# Patient Record
Sex: Female | Born: 1990 | Hispanic: Yes | Marital: Married | Smoking: Never smoker
Health system: Southern US, Community
[De-identification: ages and names within clinical notes are randomized; demographics above are authoritative.]

## PROBLEM LIST (undated history)

## (undated) DIAGNOSIS — L509 Urticaria, unspecified: Secondary | ICD-10-CM

## (undated) HISTORY — DX: Urticaria, unspecified: L50.9

---

## 2016-01-27 ENCOUNTER — Ambulatory Visit (INDEPENDENT_AMBULATORY_CARE_PROVIDER_SITE_OTHER): Payer: PRIVATE HEALTH INSURANCE | Admitting: Allergy

## 2016-01-27 ENCOUNTER — Encounter (INDEPENDENT_AMBULATORY_CARE_PROVIDER_SITE_OTHER): Payer: Self-pay

## 2016-01-27 ENCOUNTER — Encounter: Payer: Self-pay | Admitting: Allergy

## 2016-01-27 VITALS — BP 122/78 | HR 100 | Temp 98.1°F | Resp 18 | Ht 62.52 in | Wt 178.8 lb

## 2016-01-27 DIAGNOSIS — J309 Allergic rhinitis, unspecified: Secondary | ICD-10-CM | POA: Diagnosis not present

## 2016-01-27 DIAGNOSIS — L508 Other urticaria: Secondary | ICD-10-CM

## 2016-01-27 DIAGNOSIS — H1010 Acute atopic conjunctivitis, unspecified eye: Secondary | ICD-10-CM | POA: Diagnosis not present

## 2016-01-27 MED ORDER — MONTELUKAST SODIUM 10 MG PO TABS: ORAL_TABLET | ORAL | 5 refills | Status: AC

## 2016-01-27 NOTE — Progress Notes (Signed)
New Patient Note  RE: Cheryl Horne MRN: [id number] DOB: March 06, 1991 Date of Office Visit: 01/27/2016  Referring provider: No ref. provider found Primary care provider: No primary care provider on file.  Chief Complaint: Hives   History of present illness: Cheryl Horne is a 25 y.o. female presenting today for evaluation of hives.  It was recommended from her urgent care visit that she see an allergist for evaluation. She had episode of hives when she was in her teenage years. They eventually went away and she was hive free for about 10 years. The hives return in about a month ago. She is feels that the hives are bit worse now that she remembers as a teen. Again she does not know what triggered the hives. She describes them as large welts that are itchy and can be all over. She has had facial swelling as well as some hand swelling with the hives. They last about an hour or 2 before resolving and do not leave any marks or bruising. She denies any fevers or joint pain or arthralgias.  Currently she has been having them almost daily for more than a month. She states she recalls eating salmon on the day the hives started but she has had salmon previously without any problems. She denies any preceding illnesses, no medications, no stings, change in soaps/lotions/detergents. She does state her dog Puppies around the time of hives started in the puppies and dog developed fleas which needed to be treated. She reports using Benadryl which she feels like has made the hives weren't as and has on occasion use ibuprofen for menstrual-related pain which she also feels made the hives worse. Went to UC and got a steroid shot and sent home with prednisone which did help but hives return after she completed steroid course.   She reports seasonal symptoms in the spring with itchy watery eyes and some nasal congestion and drainage.  Uses flonase on occasion for her nasal symptoms.   She denies any history of asthma or  eczema or drug allergy.   Review of systems: Review of Systems  Constitutional: Negative for chills, fever and malaise/fatigue.  HENT: Negative for congestion and sore throat.   Eyes: Negative for redness.  Respiratory: Negative for cough, shortness of breath and wheezing.   Cardiovascular: Negative for chest pain.  Gastrointestinal: Negative for nausea and vomiting.  Skin: Positive for itching and rash.  Neurological: Negative for headaches.    All other systems negative unless noted above in HPI  Past medical history: Past Medical History:  Diagnosis Date  . Urticaria     Past surgical history: History reviewed. No pertinent surgical history.  Family history:  Family History  Problem Relation Age of Onset  . Allergic rhinitis Mother   . Diabetes Maternal Uncle   . Diabetes Maternal Uncle   . Diabetes Maternal Uncle   No family history of urticaria or angioedema  Social history: She lives in an apartment with carpeting with gas heating and central cooling. There are dogs in the apartment. She works as a Marine scientist.   Medication List:   Medication List       Accurate as of 01/27/16  4:49 PM. Always use your most recent med list.          montelukast 10 MG tablet Commonly known as:  SINGULAIR Take one tablet once daily as directed       Known medication allergies: No Known Allergies   Physical examination: Blood pressure 122/78,  pulse 100, temperature 98.1 F (36.7 C), temperature source Oral, resp. rate 18, height 5' 2.52" (1.588 m), weight 178 lb 12.8 oz (81.1 kg).  General: Alert, interactive, in no acute distress. HEENT: TMs pearly gray, turbinates minimally edematous without discharge, post-pharynx non erythematous. Neck: Supple without lymphadenopathy. Lungs: Clear to auscultation without wheezing, rhonchi or rales. {no increased work of breathing. CV: Normal S1, S2 without murmurs. Abdomen: Nondistended, nontender. Skin: Warm and dry, without  lesions or rashes.No urticarial lesions on exam today Extremities:  No clubbing, cyanosis or edema. Neuro:   Grossly intact.  Diagnositics/Labs: Allergy testing: Deferred due to ongoing urticaria   Assessment and plan:   Urticaria with angioedema   - No identifiable cause at this time. Hives are likely idiopathic in nature. She had an episode of hives in her teen years and was hive free for 10 years. It is not unusual for patients with a history of urticaria to have urticaria returned even years later.   - no concerning features of her hives at this time - she will let us know if she starts to have fevers, joint pains or hives start to leave marks or bruising   - start Allegra 126m or Zyrtec 130mtwice a day             Zantac 15060mwice a day             Singulair 62m74m bedtime   - will obtain the following labs to look for any underlying causes: CBC w diff, CMP, tryptase, ESR, hive panel   - If she fails on high-dose antihistamines will consider Xolair  Allergic rhinoconjunctivitis    - Presumed allergic given seasonality of symptoms    - will obtain environmental allergen panel    - use antihistamine as above     - use Flonase 1-2 sprays each nostril as needed for nasal congestion or drainage   Follow-up 1-2 months  I appreciate the opportunity to take part in Ciela's care. Please do not hesitate to contact me with questions.  Sincerely,   ShayPrudy Feeler [facility]

## 2016-01-27 NOTE — Patient Instructions (Signed)
Hives   - no concerning features of your hives at this time - let us know if you start to have fevers, joint pains or hives start to leave marks or bruising   - start Allegra 133m or Zyrtec 181mtwice a day             Zantac 1504mwice a day             Singulair 71m58m bedtime   - will obtain the following labs to look for any underlying causes: CBC w diff, CMP, tryptase, ESR, hive panel  Seasonal allergies    - will obtain environmental allergen panel    - use antihistamine as above     - use Flonase 1-2 sprays each nostril as needed for nasal congestion or drainage   Follow-up 1-2 months

## 2016-02-04 LAB — ALLERGENS W/TOTAL IGE AREA 2
Alternaria Alternata IgE: <0.1 kU/L
Aspergillus Fumigatus IgE: <0.1 kU/L
Bermuda Grass IgE: 14.9 kU/L — AB
Cedar, Mountain IgE: <0.1 kU/L
Cladosporium Herbarum IgE: <0.1 kU/L
Cockroach, German IgE: 0.11 kU/L — AB
Common Silver Birch IgE: <0.1 kU/L
Cottonwood IgE: <0.1 kU/L
D Pteronyssinus IgE: <0.1 kU/L
Dog Dander IgE: <0.1 kU/L
IgE (Immunoglobulin E), Serum: 313 IU/mL — ABNORMAL HIGH (ref 0–100)
Johnson Grass IgE: 8.69 kU/L — AB
Pigweed, Rough IgE: <0.1 kU/L
Ragweed, Short IgE: <0.1 kU/L
SHEEP SORREL IGE QN: 0.47 kU/L — AB
TIMOTHY IGE: 33.5 kU/L — AB
White Mulberry IgE: <0.1 kU/L

## 2016-02-04 LAB — CBC WITH DIFFERENTIAL/PLATELET
BASOS: 0 %
Basophils Absolute: 0 10*3/uL (ref 0.0–0.2)
EOS (ABSOLUTE): 0.1 10*3/uL (ref 0.0–0.4)
Eos: 1 %
Hematocrit: 43.9 % (ref 34.0–46.6)
Hemoglobin: 14.6 g/dL (ref 11.1–15.9)
IMMATURE GRANULOCYTES: 0 %
Immature Grans (Abs): 0 10*3/uL (ref 0.0–0.1)
LYMPHS ABS: 2.9 10*3/uL (ref 0.7–3.1)
Lymphs: 30 %
MCH: 29.4 pg (ref 26.6–33.0)
MCHC: 33.3 g/dL (ref 31.5–35.7)
MCV: 88 fL (ref 79–97)
MONOS ABS: 0.8 10*3/uL (ref 0.1–0.9)
Monocytes: 8 %
NEUTROS PCT: 61 %
Neutrophils Absolute: 5.7 10*3/uL (ref 1.4–7.0)
PLATELETS: 266 10*3/uL (ref 150–379)
RBC: 4.97 x10E6/uL (ref 3.77–5.28)
RDW: 15 % (ref 12.3–15.4)
WBC: 9.5 10*3/uL (ref 3.4–10.8)

## 2016-02-04 LAB — COMPREHENSIVE METABOLIC PANEL
A/G RATIO: 1.7 (ref 1.2–2.2)
ALT: 9 IU/L (ref 0–32)
AST: 15 IU/L (ref 0–40)
Albumin: 4.2 g/dL (ref 3.5–5.5)
Alkaline Phosphatase: 73 IU/L (ref 39–117)
BUN/Creatinine Ratio: 16 (ref 9–23)
BUN: 11 mg/dL (ref 6–20)
Bilirubin Total: 0.3 mg/dL (ref 0.0–1.2)
CALCIUM: 9.1 mg/dL (ref 8.7–10.2)
CO2: 22 mmol/L (ref 18–29)
CREATININE: 0.67 mg/dL (ref 0.57–1.00)
Chloride: 98 mmol/L (ref 96–106)
GFR calc Af Amer: 141 mL/min/{1.73_m2} (ref >59)
GFR, EST NON AFRICAN AMERICAN: 123 mL/min/{1.73_m2} (ref >59)
GLUCOSE: 70 mg/dL (ref 65–99)
Globulin, Total: 2.5 g/dL (ref 1.5–4.5)
POTASSIUM: 3.5 mmol/L (ref 3.5–5.2)
Sodium: 137 mmol/L (ref 134–144)
Total Protein: 6.7 g/dL (ref 6.0–8.5)

## 2016-02-04 LAB — SEDIMENTATION RATE: SED RATE: 12 mm/h (ref 0–32)

## 2016-02-04 LAB — TRYPTASE: Tryptase: 2.5 ug/L (ref 2.2–13.2)

## 2016-02-04 LAB — CHRONIC URTICARIA: CU INDEX: 16 — AB (ref <10)

## 2016-03-16 ENCOUNTER — Ambulatory Visit: Payer: Self-pay | Admitting: Allergy

## 2016-03-31 ENCOUNTER — Telehealth: Payer: Self-pay | Admitting: *Deleted

## 2016-03-31 NOTE — Telephone Encounter (Signed)
Patient called an advised hives getting worse even on antihistamine therapy. You had discussed with her starting Xolair and she wants to go forward with same but will not have insurance till January 2018 on her husbands policy.  I told her I would ask you if okay to start her on sample at this time.

## 2016-04-01 NOTE — Telephone Encounter (Signed)
Discussed via phone ok to give Xolair sample 300mg  while await her new insurance coverage in 2018.

## 2016-04-02 ENCOUNTER — Ambulatory Visit (INDEPENDENT_AMBULATORY_CARE_PROVIDER_SITE_OTHER): Payer: PRIVATE HEALTH INSURANCE

## 2016-04-02 DIAGNOSIS — L501 Idiopathic urticaria: Secondary | ICD-10-CM

## 2016-04-02 DIAGNOSIS — L508 Other urticaria: Secondary | ICD-10-CM

## 2016-04-02 MED ORDER — OMALIZUMAB 150 MG ~~LOC~~ SOLR
300.0000 mg | SUBCUTANEOUS | Status: AC
  Administered 2016-04-02 – 2016-10-26 (×7): 300 mg via SUBCUTANEOUS

## 2016-05-07 ENCOUNTER — Ambulatory Visit (INDEPENDENT_AMBULATORY_CARE_PROVIDER_SITE_OTHER): Payer: PRIVATE HEALTH INSURANCE

## 2016-05-07 DIAGNOSIS — L501 Idiopathic urticaria: Secondary | ICD-10-CM

## 2016-06-17 ENCOUNTER — Ambulatory Visit (INDEPENDENT_AMBULATORY_CARE_PROVIDER_SITE_OTHER): Payer: PRIVATE HEALTH INSURANCE

## 2016-06-17 DIAGNOSIS — L501 Idiopathic urticaria: Secondary | ICD-10-CM | POA: Diagnosis not present

## 2016-07-27 ENCOUNTER — Ambulatory Visit (INDEPENDENT_AMBULATORY_CARE_PROVIDER_SITE_OTHER): Payer: Commercial Managed Care - PPO | Admitting: *Deleted

## 2016-07-27 DIAGNOSIS — L501 Idiopathic urticaria: Secondary | ICD-10-CM

## 2016-08-31 ENCOUNTER — Ambulatory Visit (INDEPENDENT_AMBULATORY_CARE_PROVIDER_SITE_OTHER): Payer: Commercial Managed Care - PPO

## 2016-08-31 DIAGNOSIS — L501 Idiopathic urticaria: Secondary | ICD-10-CM | POA: Diagnosis not present

## 2016-08-31 DIAGNOSIS — L508 Other urticaria: Secondary | ICD-10-CM

## 2016-09-28 ENCOUNTER — Ambulatory Visit (INDEPENDENT_AMBULATORY_CARE_PROVIDER_SITE_OTHER): Payer: Commercial Managed Care - PPO | Admitting: *Deleted

## 2016-09-28 DIAGNOSIS — L500 Allergic urticaria: Secondary | ICD-10-CM

## 2016-10-26 ENCOUNTER — Ambulatory Visit (INDEPENDENT_AMBULATORY_CARE_PROVIDER_SITE_OTHER): Payer: Commercial Managed Care - PPO | Admitting: *Deleted

## 2016-10-26 DIAGNOSIS — L500 Allergic urticaria: Secondary | ICD-10-CM

## 2016-11-23 ENCOUNTER — Ambulatory Visit: Payer: Self-pay

## 2019-06-08 ENCOUNTER — Other Ambulatory Visit: Payer: Self-pay | Admitting: Obstetrics and Gynecology

## 2019-06-08 ENCOUNTER — Ambulatory Visit
Admission: RE | Admit: 2019-06-08 | Discharge: 2019-06-08 | Disposition: A | Discharge status: Home / Self Care | Payer: Commercial Managed Care - PPO | Source: Ambulatory Visit | Attending: Obstetrics and Gynecology | Admitting: Obstetrics and Gynecology

## 2019-06-08 ENCOUNTER — Other Ambulatory Visit: Payer: Self-pay

## 2019-06-08 DIAGNOSIS — N61 Mastitis without abscess: Secondary | ICD-10-CM

## 2019-06-08 DIAGNOSIS — N63 Unspecified lump in unspecified breast: Secondary | ICD-10-CM

## 2019-06-13 ENCOUNTER — Ambulatory Visit
Admission: RE | Admit: 2019-06-13 | Discharge: 2019-06-13 | Disposition: A | Discharge status: Home / Self Care | Payer: Medicaid Other | Source: Ambulatory Visit | Attending: Obstetrics and Gynecology | Admitting: Obstetrics and Gynecology

## 2019-06-13 ENCOUNTER — Ambulatory Visit
Admission: RE | Admit: 2019-06-13 | Discharge: 2019-06-13 | Disposition: A | Discharge status: Home / Self Care | Payer: Self-pay | Source: Ambulatory Visit | Attending: Obstetrics and Gynecology | Admitting: Obstetrics and Gynecology

## 2019-06-13 ENCOUNTER — Other Ambulatory Visit: Payer: Self-pay | Admitting: Obstetrics and Gynecology

## 2019-06-13 ENCOUNTER — Other Ambulatory Visit: Payer: Self-pay

## 2019-06-13 DIAGNOSIS — N61 Mastitis without abscess: Secondary | ICD-10-CM

## 2019-06-18 LAB — AEROBIC/ANAEROBIC CULTURE W GRAM STAIN (SURGICAL/DEEP WOUND)

## 2019-06-25 ENCOUNTER — Other Ambulatory Visit: Payer: Self-pay | Admitting: Obstetrics and Gynecology

## 2019-06-25 ENCOUNTER — Other Ambulatory Visit: Payer: Self-pay

## 2019-06-25 ENCOUNTER — Ambulatory Visit
Admission: RE | Admit: 2019-06-25 | Discharge: 2019-06-25 | Disposition: A | Discharge status: Home / Self Care | Payer: Medicaid Other | Source: Ambulatory Visit | Attending: Obstetrics and Gynecology | Admitting: Obstetrics and Gynecology

## 2019-06-25 DIAGNOSIS — N61 Mastitis without abscess: Secondary | ICD-10-CM

## 2019-06-26 ENCOUNTER — Other Ambulatory Visit: Payer: Self-pay | Admitting: Obstetrics and Gynecology

## 2019-06-26 ENCOUNTER — Telehealth: Payer: Self-pay

## 2019-06-26 DIAGNOSIS — N61 Mastitis without abscess: Secondary | ICD-10-CM

## 2019-06-26 NOTE — Telephone Encounter (Signed)
Telephoned patient at home number. Left voice message with [facility] scheduling contact information. 

## 2019-06-28 ENCOUNTER — Ambulatory Visit: Payer: Self-pay | Admitting: Student

## 2019-06-28 ENCOUNTER — Other Ambulatory Visit: Payer: Self-pay

## 2019-06-28 VITALS — BP 122/80 | Temp 98.0°F | Wt 178.0 lb

## 2019-06-28 DIAGNOSIS — Z1239 Encounter for other screening for malignant neoplasm of breast: Secondary | ICD-10-CM

## 2019-06-28 NOTE — Progress Notes (Signed)
Cheryl Horne is a 29 y.o. female who presents to [facility] clinic today with complaint of right breast pain and abscess treated with antibiotics x 2.   She still has an abscess in her right breast and has biopsy scheduled for March 15.   Pap Smear: Pap not smear completed today. Last Pap smear was [facility] on May 2019 and was normal. . Per patient has no history of an abnormal Pap smear. Last Pap smear result is not available in Epic.   Physical exam: Breasts Breasts symmetrical. No skin abnormalities bilateral breasts. No nipple retraction bilateral breasts. No nipple discharge bilateral breasts. No lymphadenopathy. No lumps palpated bilateral breasts.    Patient has bandage at 3 o'clock on right breast; bandage not removed. Patient has small mole with irregular borders and overlapping colors or brown and black. Patient says it has not changed colors; it does not hurt.    Pelvic/Bimanual Pap is not indicated today    Smoking History: Patient has never smoked    Patient Navigation: Patient education provided. Access to services provided for patient through [facility] program. Colorectal Cancer Screening: Per patient has never had colonoscopy completed No complaints today.    Breast and Cervical Cancer Risk Assessment: Patient does not have family history of breast cancer, known genetic mutations, or radiation treatment to the chest before age 56. Patient does not have history of cervical dysplasia, immunocompromised, or DES exposure in-utero.  Risk Assessment    No risk assessment data      A: [facility] exam without pap smear Complaint of right breast abscess and pain  P: Referred patient to the [facility] for a biopsy. Appointment scheduled March 15 at 245. List of referrals given for skin cancer screening locations for mole check.   Marylene Land, CNM 06/28/2019 10:02 AM

## 2019-07-02 ENCOUNTER — Ambulatory Visit
Admission: RE | Admit: 2019-07-02 | Discharge: 2019-07-02 | Disposition: A | Discharge status: Home / Self Care | Payer: No Typology Code available for payment source | Source: Ambulatory Visit | Attending: Obstetrics and Gynecology | Admitting: Obstetrics and Gynecology

## 2019-07-02 ENCOUNTER — Ambulatory Visit
Admission: RE | Admit: 2019-07-02 | Discharge: 2019-07-02 | Disposition: A | Discharge status: Home / Self Care | Payer: Medicaid Other | Source: Ambulatory Visit | Attending: Obstetrics and Gynecology | Admitting: Obstetrics and Gynecology

## 2019-07-02 ENCOUNTER — Other Ambulatory Visit: Payer: Self-pay | Admitting: Obstetrics and Gynecology

## 2019-07-02 ENCOUNTER — Other Ambulatory Visit: Payer: Self-pay

## 2019-07-02 DIAGNOSIS — N61 Mastitis without abscess: Secondary | ICD-10-CM

## 2020-02-18 ENCOUNTER — Telehealth: Payer: Self-pay | Admitting: *Deleted

## 2020-02-18 NOTE — Telephone Encounter (Signed)
Patient called stating she had received a bill for pathology date of service 07/02/19. Gave patient fax number to fax copy of bill to confirm is covered by [facility]. Patient verbalized understanding.

## 2020-08-27 ENCOUNTER — Telehealth: Payer: Self-pay | Admitting: *Deleted

## 2020-08-27 NOTE — Telephone Encounter (Signed)
Patient called and left voicemail. Called patient back. Patient stated she got a bill from [facility] ([facility]) for date of service 05/19/2020 and she was a current [facility] patient. Explained to patient that visit is not covered by [facility] due to it was a new problem since it was her left breast. Explained to patient that when she has a new breast symptom that she needs to be seen in [facility] first for it to be covered by [facility]. Patient was given antibiotics at her last visit at [facility] and recommendation was to follow up if symptoms have not improved or worsen. Patient's [facility] expired 06/27/2020. I asked patient if she has any new breast symptoms or if the symptoms have resolved. Patient stated she doesn't currently have any symptoms and that the symptoms resolved. Let patient know that if she develops any new symptoms to give [place] a call to schedule an appointment with [facility]. Patient verbalized understanding.

## 2021-04-28 IMAGING — US US BREAST*R* LIMITED INC AXILLA
1 series · 8 of 8 positions shown · non-contrast
Comparison: Previous exam(s).

CLINICAL DATA: The patient has been followed for suspected
mastitis, abscess, and phlegmon. She had 3 cc pus aspirated June 13, 2019 and she has had an incision and drainage performed by
surgery. She presents today for follow-up. She feels as if she is
improving somewhat.

EXAM:
ULTRASOUND OF THE RIGHT BREAST

[Series 1: us breast*right* limited inc axilla · 0.07mm/px · 8 of 8 slices shown]
[im 1/8]
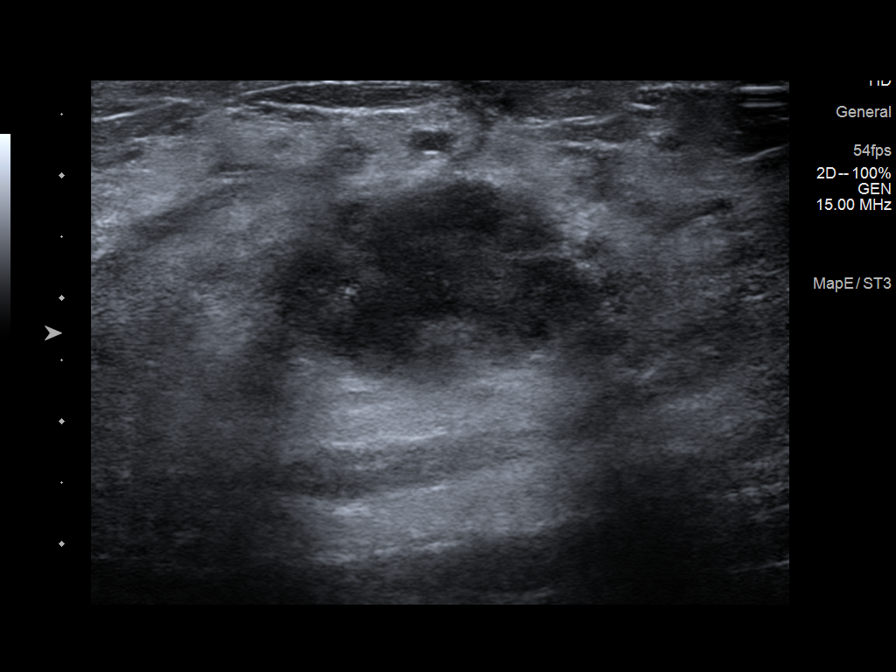
[im 2/8]
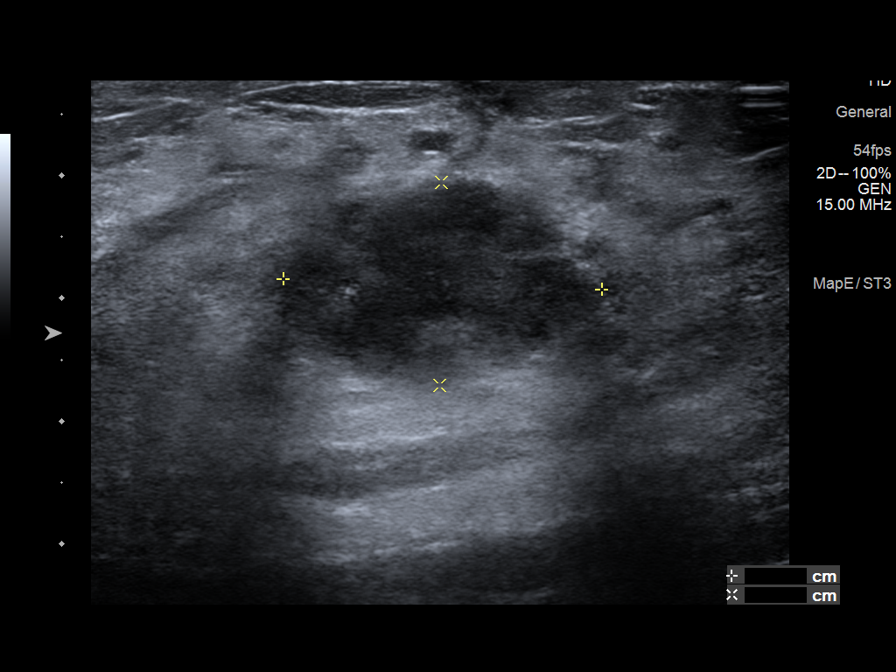
[im 3/8]
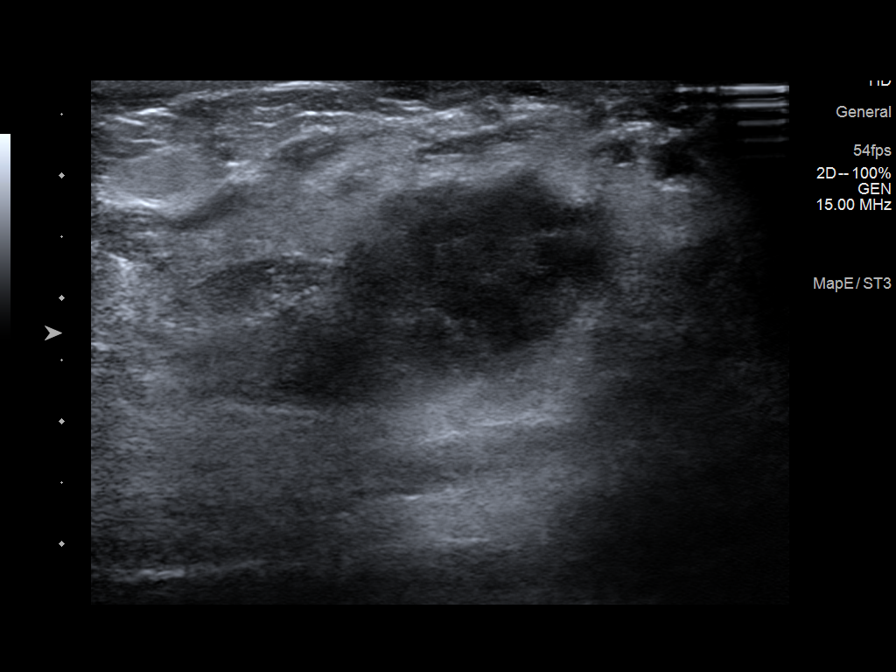
[im 4/8]
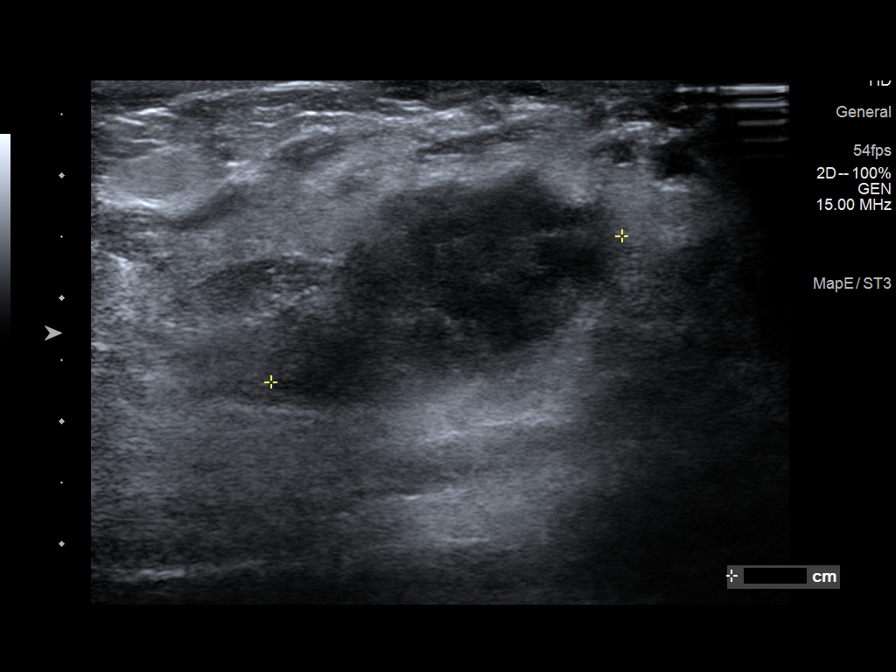
[im 5/8]
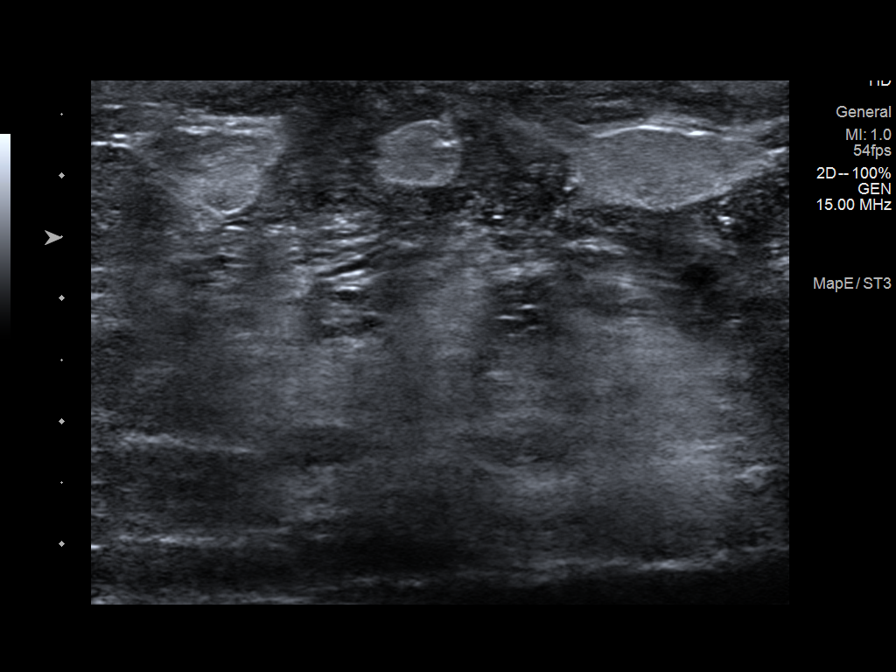
[im 6/8]
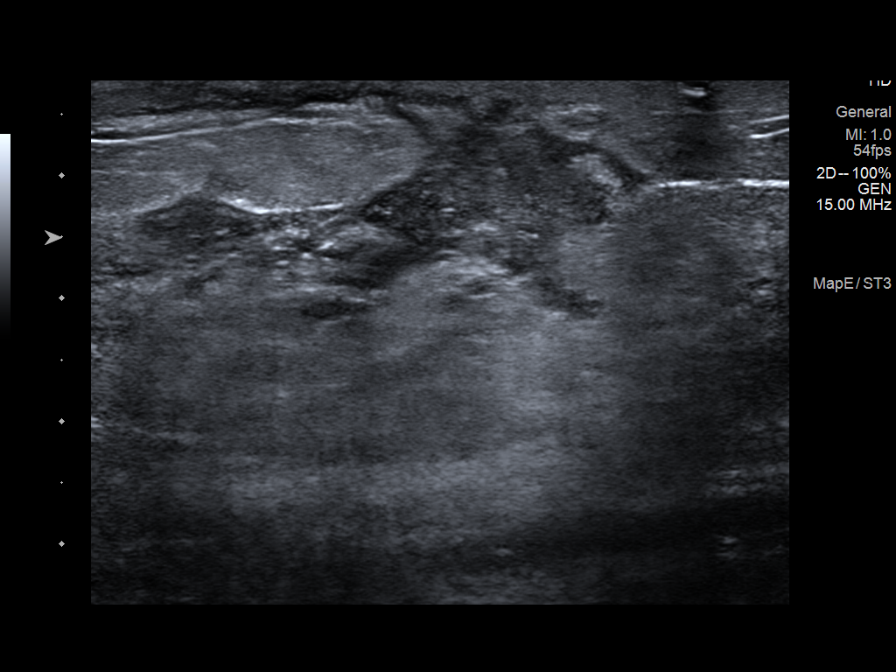
[im 7/8]
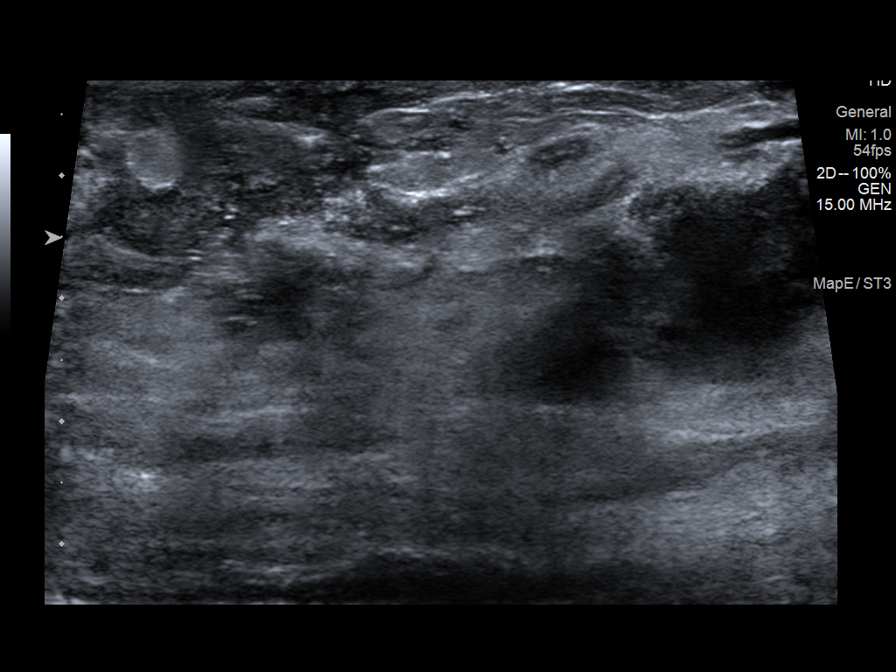
[im 8/8]
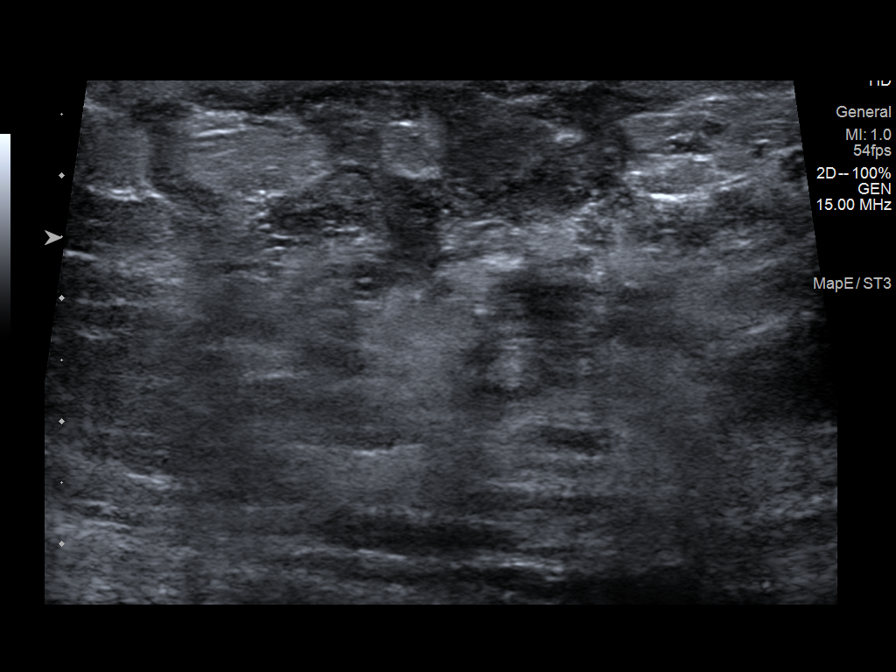

[8 of 8 positions shown; findings below may reference images not displayed]

FINDINGS: On physical exam, there is firmness and erythema in the region of
the patient's symptoms in the superior right breast.

Targeted ultrasound is performed, showing a hypoechoic masslike
region measuring 2.6 x 1.7 x 3.1 cm today versus 1.6 x 1.0 x 1.5 cm
on June 08, 2019, substantially larger since first seen. There
is continued edema and skin thickening. There is continued
hypoechoic material infiltrating the subcutaneous fat which is not
oval.
IMPRESSION: The masslike region in the breast which is previously been called a
phlegmon has slowly grown since June 08, 2019. Given the
aspiration of pus on June 13, 2019 and an incision and drainage,
the findings are likely infectious but not definitive.

RECOMMENDATION:
The patient is scheduled for a surgical appointment today. She is
scheduled to return to the [REDACTED] in 1 week. If the masslike
region in the patient's breast is not clearly improving during her
next visit, we will proceed to biopsy of the masslike region in her
right breast. There was a mildly abnormal node in the right axilla
on June 08, 2019. If the biopsy demonstrates malignancy,
recommend biopsying 1 of the right axillary nodes.

I have discussed the findings and recommendations with the patient.
If applicable, a reminder letter will be sent to the patient
regarding the next appointment.

BI-RADS CATEGORY  3: Probably benign.

## 2021-05-05 IMAGING — US US  BREAST BX W/ LOC DEV 1ST LESION IMG BX SPEC US GUIDE*R*
1 series · 9 of 9 positions shown · non-contrast
Comparison: Previous exam(s).
COMPARISON: Previous exam(s).

Addendum:
CLINICAL DATA: Right breast biopsy.

EXAM:
ULTRASOUND GUIDED RIGHT BREAST CORE NEEDLE BIOPSY

[Series 1: us breast bx w/ loc dev 1st lesion img bx spec us  · 0.07mm/px · 9 of 9 slices shown]
[im 1/9]
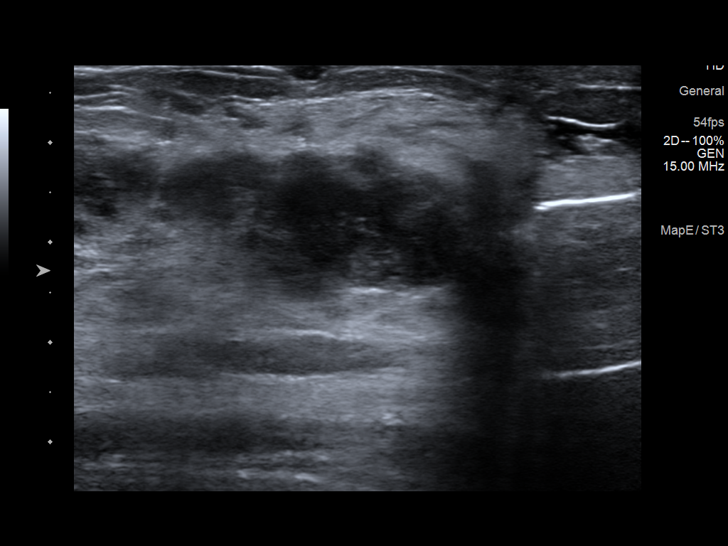
[im 2/9]
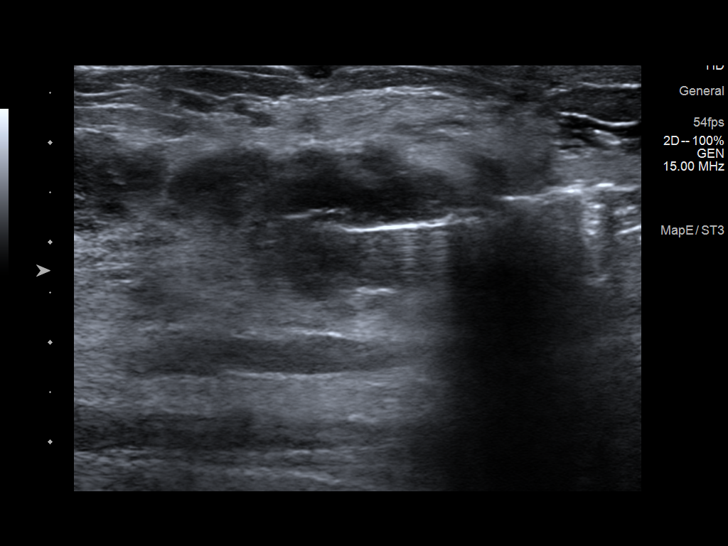
[im 3/9]
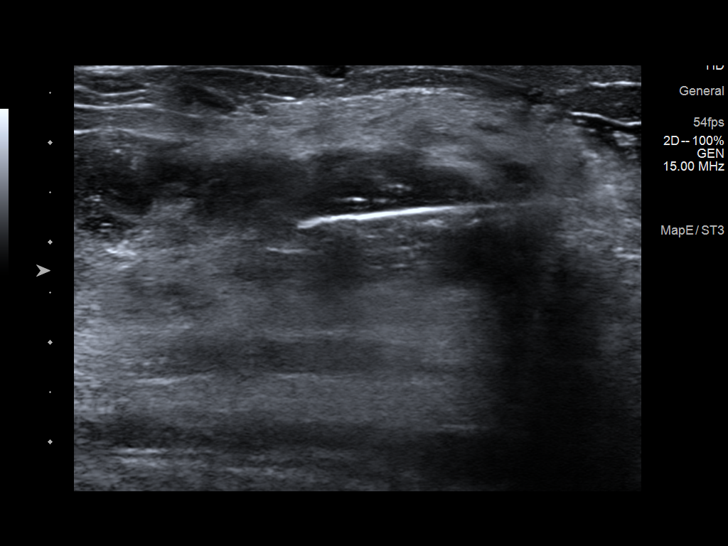
[im 4/9]
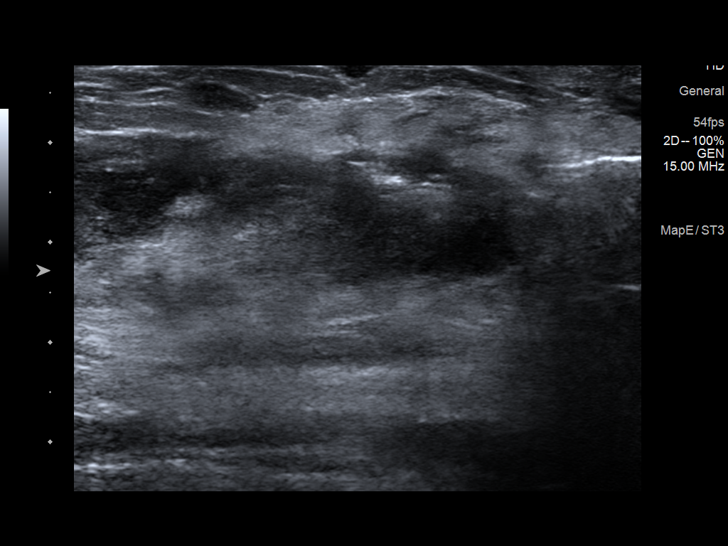
[im 5/9]
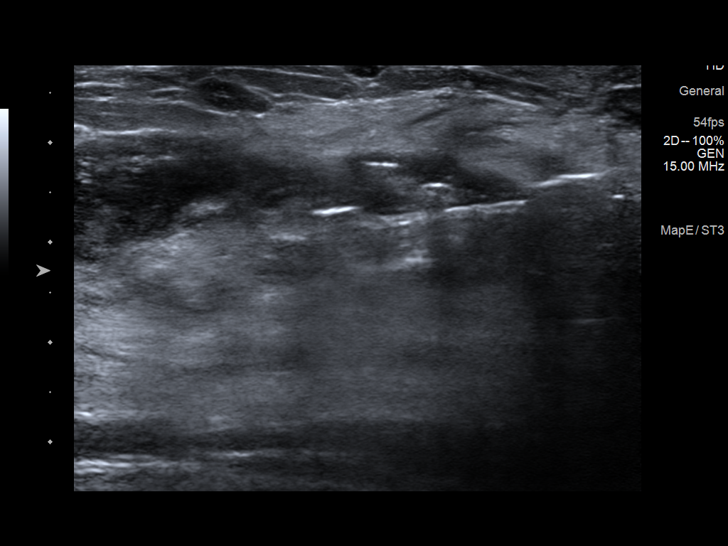
[im 6/9]
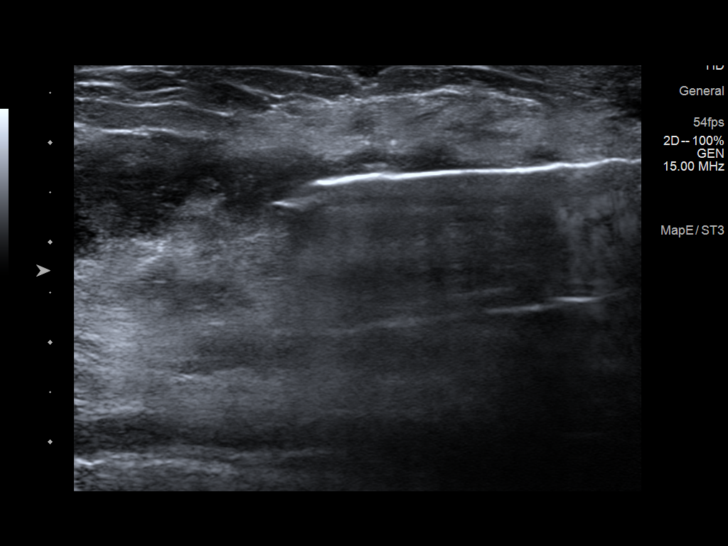
[im 7/9]
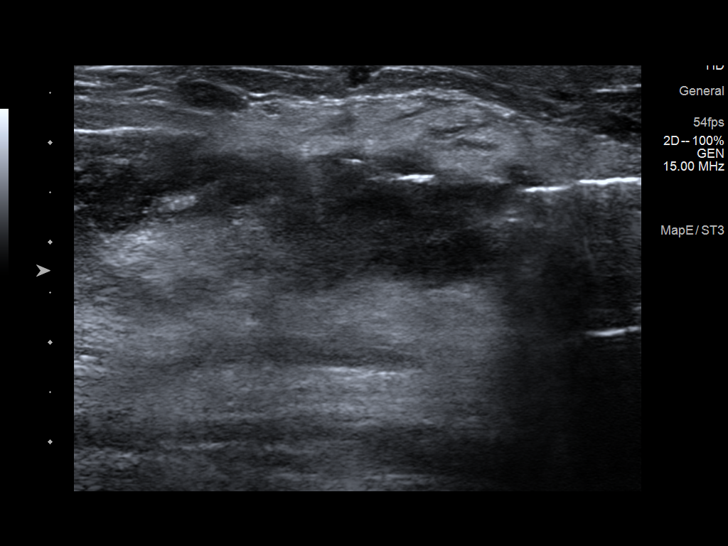
[im 8/9]
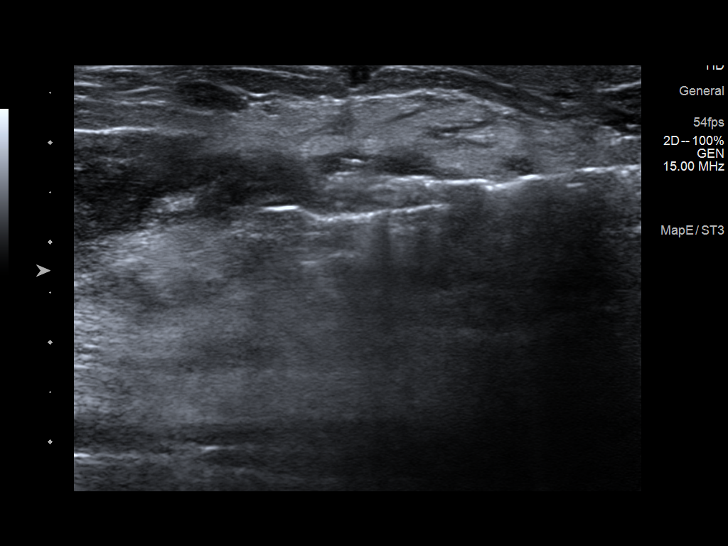
[im 9/9]
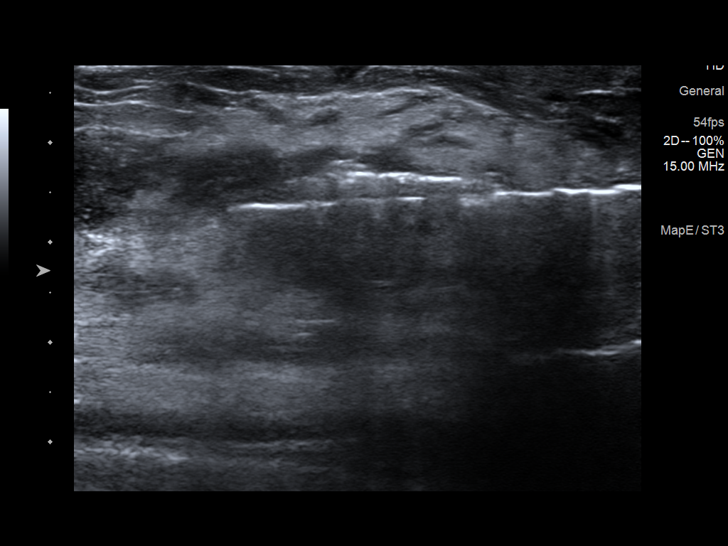

[9 of 9 positions shown; findings below may reference images not displayed]



Lesion quadrant: 12 o'clock, 1 cm from the nipple

Using sterile technique and 1% Lidocaine as local anesthetic, under
direct ultrasound visualization, a 12 gauge Nadine device was
used to perform biopsy of a focal hypoechoic region in the right
breast. Using a lateral approach. At the conclusion of the procedure
A tissue marker clip was not deployed in the breast due to the
possibility of infection. Follow up 2 view mammogram was performed
and dictated separately.
IMPRESSION: Ultrasound guided biopsy of a masslike region of hypoechogenicity in
the right breast at 12 o'clock, 1 cm from the nipple. No apparent
complications.

ADDENDUM:
Pathology revealed GRANULOMATOUS MASTITIS of the RIGHT breast, 12
o'clock. This was found to be concordant by Dr. Elshen Mammedov [place].

Diagnosis Note: AFB and Kaydron Gutzmore are pending and will be
reported in an addendum.

Pathology results were discussed with the patient by telephone. The
patient reported doing well after the biopsy with tenderness at the
site. Post biopsy instructions and care were reviewed and questions
were answered. The patient was encouraged to call The [REDACTED]

The patient is scheduled for follow up appointment with Dr. Samakay
Filepus at [REDACTED] on July 24, 2019.

Pathology results reported by Ginna Tiger RN on 07/03/2019.



Lesion quadrant: 12 o'clock, 1 cm from the nipple

Using sterile technique and 1% Lidocaine as local anesthetic, under
direct ultrasound visualization, a 12 gauge Nadine device was
used to perform biopsy of a focal hypoechoic region in the right
breast. Using a lateral approach. At the conclusion of the procedure
A tissue marker clip was not deployed in the breast due to the
possibility of infection. Follow up 2 view mammogram was performed
and dictated separately.
IMPRESSION: Ultrasound guided biopsy of a masslike region of hypoechogenicity in
the right breast at 12 o'clock, 1 cm from the nipple. No apparent
complications.

## 2021-05-05 IMAGING — US US BREAST*R* LIMITED INC AXILLA
2 series · 8 of 8 positions shown · non-contrast
Comparison: Previous exam(s).

CLINICAL DATA: The patient developed a lump in her right breast in
[REDACTED]. Eventually, pain and erythema developed in the region of
the lump. She was seen at the [REDACTED] June 08, 2019 after
being treated with antibiotics for mastitis. The imaging findings at
that time were thought to represent mastitis with
phlegmon/developing abscess and an abnormal right axillary lymph
node. The patient was instructed to remain on antibiotics and return
in 1 week. When she returned in 1 week, there was clinical worsening
and the patient underwent attempted abscess drainage which yielded 3
cc of purulence fluid. She followed up June 25, 2019 with a
continued lump and some erythema which had improved. She presents
today for follow-up.

EXAM:
ULTRASOUND OF THE RIGHT BREAST

[Series 1: us breast*right* limited inc axilla · 0.07mm/px · 7 of 7 slices shown (1 of 2)]
[im 1/7]
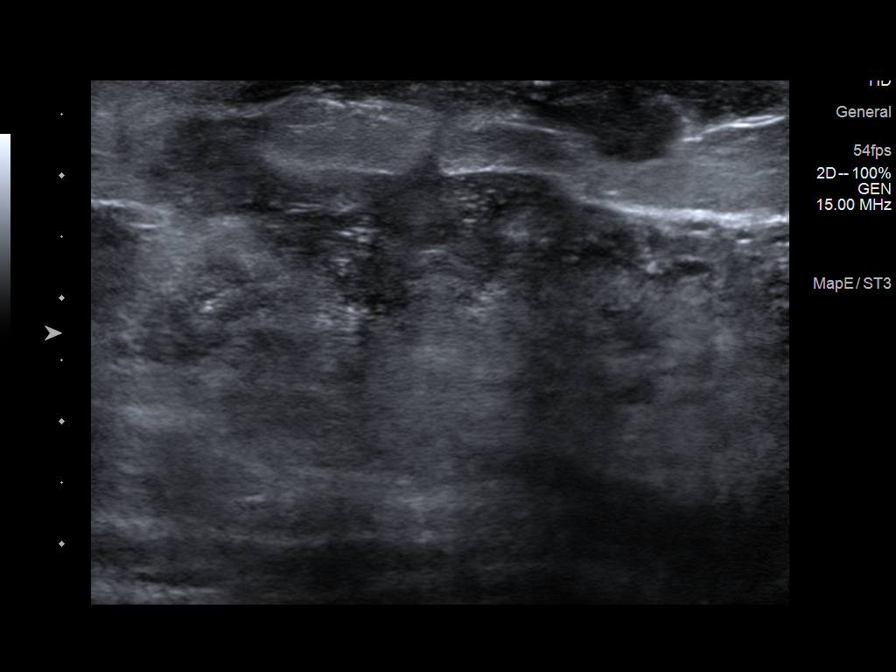
[im 2/7]
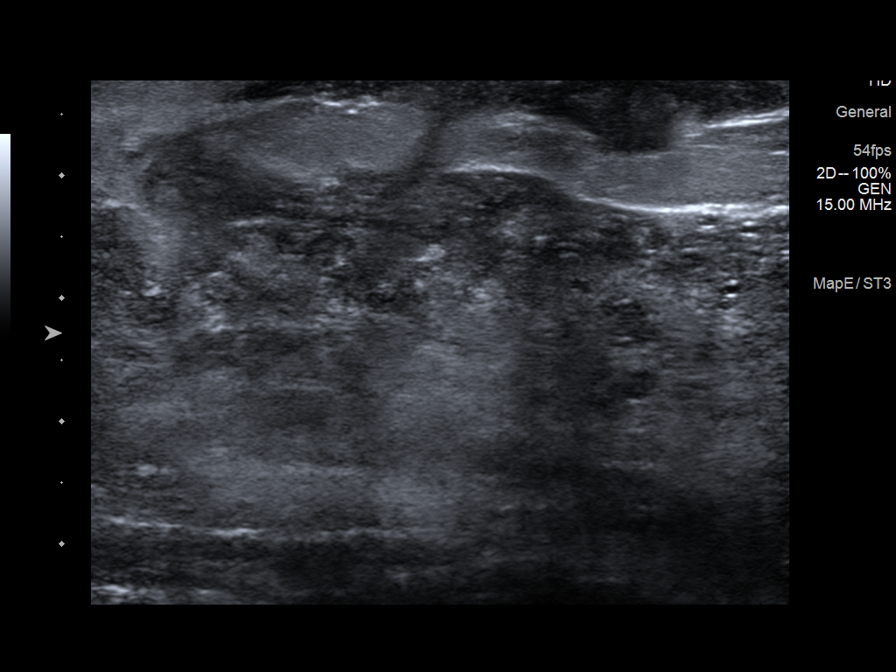
[im 3/7]
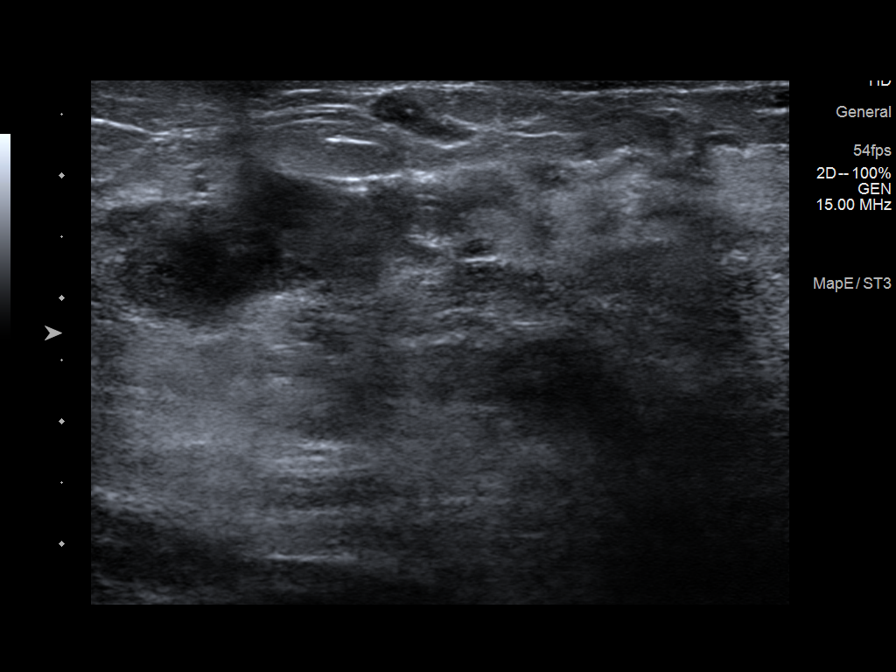
[im 4/7]
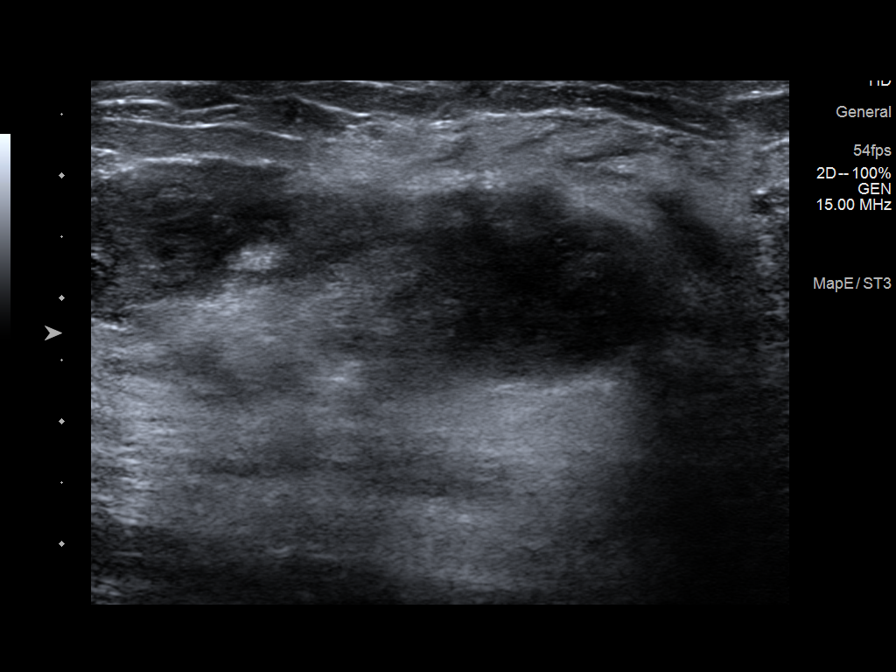
[im 5/7]
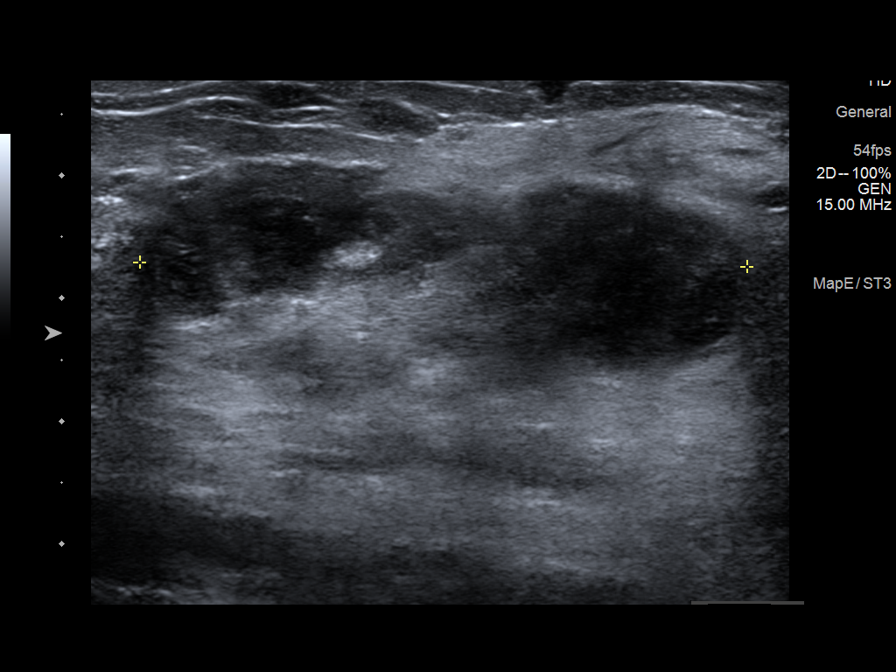
[im 6/7]
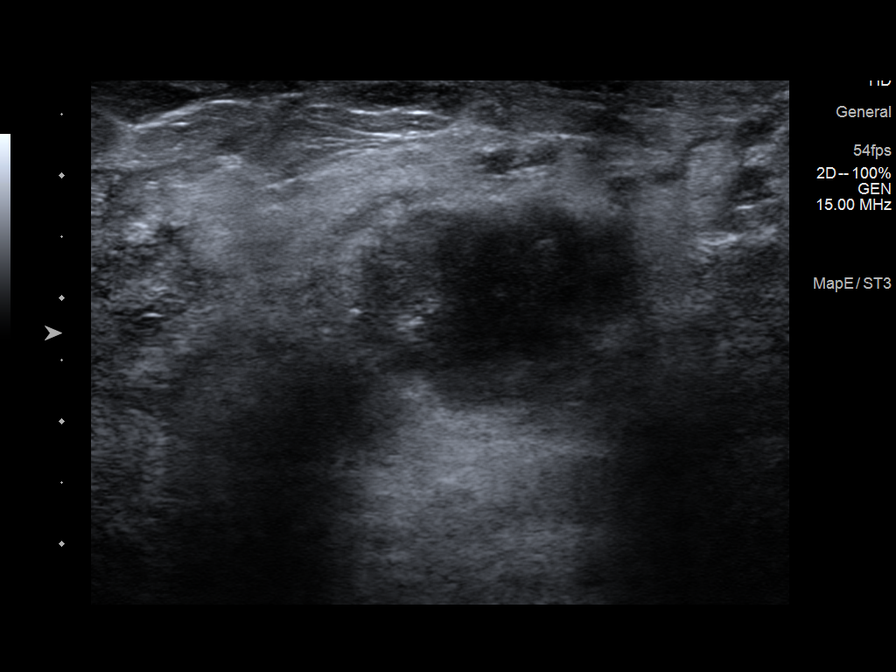
[im 7/7]
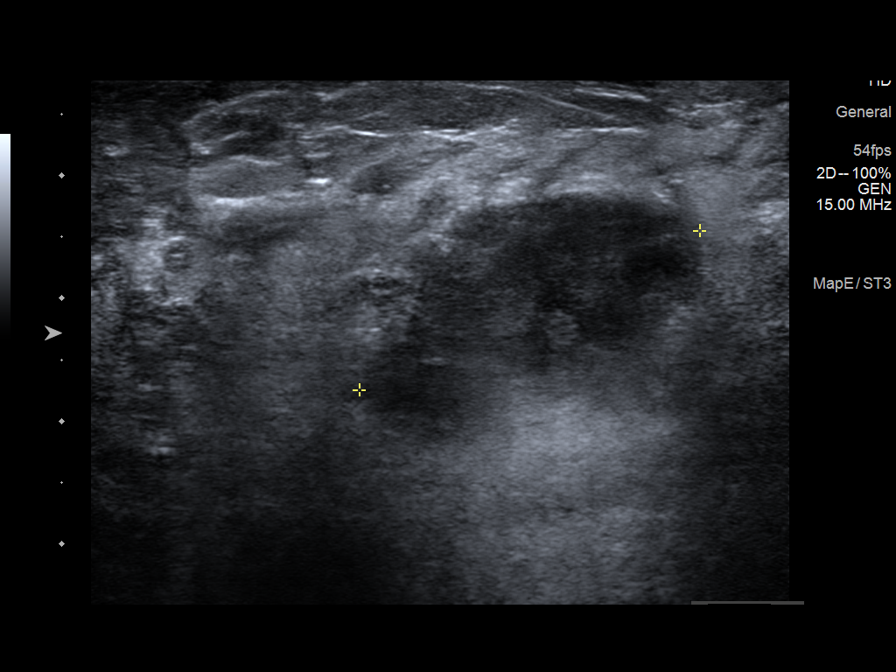

[Series 2: us breast*right* limited inc axilla · 0.07mm/px · 1 of 1 slices shown (2 of 2)]
[im 1/1]
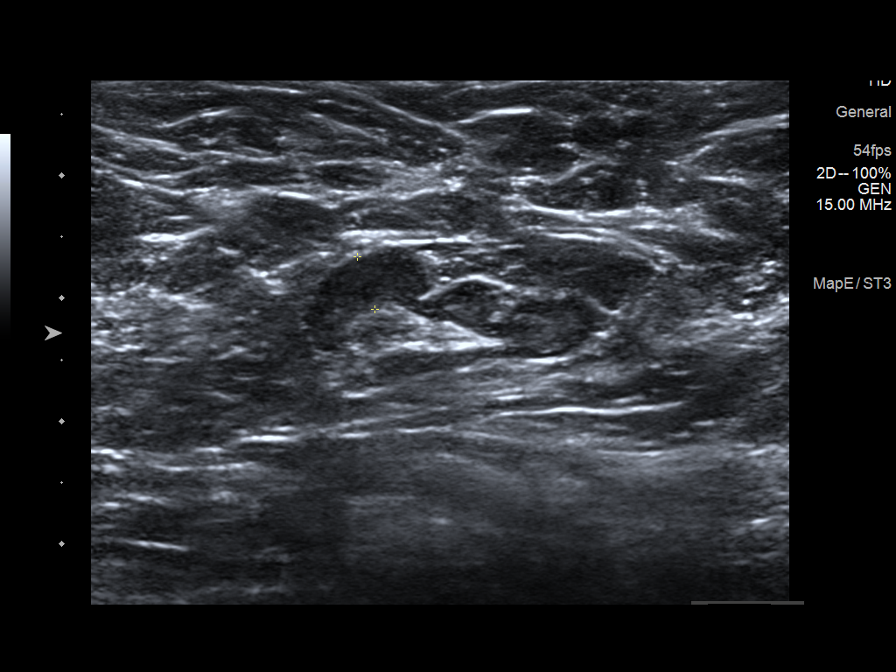

[8 of 8 positions shown; findings below may reference images not displayed]

FINDINGS: On physical exam, there is erythema at 12 o'clock in the right
breast. A firm lump remains.

Targeted ultrasound is performed, showing serpiginous hypoechoic
material just beneath the skin. A more focal hypoechoic region is
seen at 12 o'clock, 1 cm from the nipple measuring 4.9 by 3.1 cm,
larger in the interval. A single abnormal axillary node remains.
IMPRESSION: The findings are favored to represent either infection which is been
resistant to antibiotics or granulomatous mastitis. However, given
symptoms for greater than 2 months without significant improvement,
biopsy is recommended of the focal hypoechoic region to exclude
inflammatory breast cancer.

RECOMMENDATION:
Recommend biopsy of the focal hypoechoic region in the right breast
at 12 o'clock, 1 cm from the nipple.

I have discussed the findings and recommendations with the patient.
If applicable, a reminder letter will be sent to the patient
regarding the next appointment.

BI-RADS CATEGORY  4: Suspicious.
